# Patient Record
Sex: Male | Born: 1960 | Race: White | Hispanic: No | Marital: Single | Smoking: Former smoker
Health system: Southern US, Community
[De-identification: ages and names within clinical notes are randomized; demographics above are authoritative.]

---

## 2010-07-26 ENCOUNTER — Ambulatory Visit: Payer: Self-pay | Admitting: Internal Medicine

## 2012-05-31 ENCOUNTER — Ambulatory Visit: Payer: Self-pay | Admitting: Internal Medicine

## 2013-05-31 ENCOUNTER — Ambulatory Visit: Payer: Self-pay | Admitting: Family Medicine

## 2019-06-10 ENCOUNTER — Ambulatory Visit: Payer: Self-pay | Attending: Internal Medicine

## 2019-06-10 DIAGNOSIS — Z23 Encounter for immunization: Secondary | ICD-10-CM

## 2019-06-10 NOTE — Progress Notes (Signed)
   Covid-19 Vaccination Clinic  Name:  HRISHIKESH HOEG    MRN: [id number] DOB: 12-21-1960  06/10/2019  Mr. Korn was observed post Covid-19 immunization for 30 minutes based on pre-vaccination screening without incident. He was provided with Vaccine Information Sheet and instruction to access the V-Safe system.   Mr. Broski was instructed to call 911 with any severe reactions post vaccine: Marland Kitchen Difficulty breathing  . Swelling of face and throat  . A fast heartbeat  . A bad rash all over body  . Dizziness and weakness   Immunizations Administered    Name Date Dose VIS Date Route   Pfizer COVID-19 Vaccine 06/10/2019 12:51 PM 0.3 mL 02/22/2019 Intramuscular   Manufacturer: [employer], [employer]   Lot: [id number]   NDC: [address]-6

## 2019-07-03 ENCOUNTER — Ambulatory Visit: Payer: Self-pay | Attending: Internal Medicine

## 2019-07-03 DIAGNOSIS — Z23 Encounter for immunization: Secondary | ICD-10-CM

## 2019-07-03 NOTE — Progress Notes (Signed)
   Covid-19 Vaccination Clinic  Name:  Kevin Friedman    MRN: [id number] DOB: [birth year]  07/03/2019  Kevin Friedman was observed post Covid-19 immunization for 15 minutes without incident. He was provided with Vaccine Information Sheet and instruction to access the V-Safe system.   Kevin Friedman was instructed to call 911 with any severe reactions post vaccine: Marland Kitchen Difficulty breathing  . Swelling of face and throat  . A fast heartbeat  . A bad rash all over body  . Dizziness and weakness   Immunizations Administered    Name Date Dose VIS Date Route   Pfizer COVID-19 Vaccine 07/03/2019  1:34 PM 0.3 mL 05/08/2018 Intramuscular   Manufacturer: [employer], [employer]   Lot: [id number]   NDC: [address]-1

## 2019-07-26 ENCOUNTER — Encounter: Payer: Self-pay | Admitting: Emergency Medicine

## 2019-07-26 ENCOUNTER — Other Ambulatory Visit: Payer: Self-pay

## 2019-07-26 ENCOUNTER — Emergency Department

## 2019-07-26 ENCOUNTER — Emergency Department
Admission: EM | Admit: 2019-07-26 | Discharge: 2019-07-26 | Disposition: A | Discharge status: Home / Self Care | Attending: Emergency Medicine | Admitting: Emergency Medicine

## 2019-07-26 DIAGNOSIS — Y990 Civilian activity done for income or pay: Secondary | ICD-10-CM | POA: Diagnosis not present

## 2019-07-26 DIAGNOSIS — S61215A Laceration without foreign body of left ring finger without damage to nail, initial encounter: Secondary | ICD-10-CM | POA: Diagnosis present

## 2019-07-26 DIAGNOSIS — W231XXA Caught, crushed, jammed, or pinched between stationary objects, initial encounter: Secondary | ICD-10-CM | POA: Insufficient documentation

## 2019-07-26 DIAGNOSIS — R52 Pain, unspecified: Secondary | ICD-10-CM

## 2019-07-26 DIAGNOSIS — Y9289 Other specified places as the place of occurrence of the external cause: Secondary | ICD-10-CM | POA: Diagnosis not present

## 2019-07-26 DIAGNOSIS — Y9389 Activity, other specified: Secondary | ICD-10-CM | POA: Insufficient documentation

## 2019-07-26 MED ORDER — OXYCODONE-ACETAMINOPHEN 7.5-325 MG PO TABS: 1.0000 | ORAL_TABLET | Freq: Four times a day (QID) | ORAL | 0 refills | Status: AC | PRN

## 2019-07-26 MED ORDER — LIDOCAINE HCL (PF) 1 % IJ SOLN
5.0000 mL | Freq: Once | INTRAMUSCULAR | Status: DC
  Filled 2019-07-26: qty 5

## 2019-07-26 MED ORDER — OXYCODONE-ACETAMINOPHEN 5-325 MG PO TABS
1.0000 | ORAL_TABLET | Freq: Once | ORAL | Status: AC
  Administered 2019-07-26: 1 via ORAL
  Filled 2019-07-26: qty 1

## 2019-07-26 MED ORDER — OXYCODONE-ACETAMINOPHEN 7.5-325 MG PO TABS: 1.0000 | ORAL_TABLET | Freq: Four times a day (QID) | ORAL | 0 refills | Status: DC | PRN

## 2019-07-26 MED ORDER — BACITRACIN-NEOMYCIN-POLYMYXIN 400-5-5000 EX OINT
TOPICAL_OINTMENT | Freq: Once | CUTANEOUS | Status: AC
  Filled 2019-07-26: qty 1

## 2019-07-26 NOTE — ED Provider Notes (Signed)
[facility] Emergency Department Provider Note   ____________________________________________   First MD Initiated Contact with Patient 07/26/19 1240     (approximate)  I have reviewed the triage vital signs and the nursing notes.   HISTORY  Chief Complaint Laceration    HPI Kevin Friedman is a 59 y.o. male patient presents for laceration to the palmar aspect of the left ring finger.  Hemorrhage was controlled direct pressure.  Patient denies loss sensation or loss of function.  Patient is right-hand dominant/ tetanus shot is up-to-date.         History reviewed. No pertinent past medical history.  There are no problems to display for this patient.   History reviewed. No pertinent surgical history.  Prior to Admission medications   Medication Sig Start Date End Date Taking? Authorizing Provider  oxyCODONE-acetaminophen (PERCOCET) 7.5-325 MG tablet Take 1 tablet by mouth every 6 (six) hours as needed. 07/26/19   Joni Reining, PA-C    Allergies Penicillins  No family history on file.  Social History Social History   Tobacco Use  . Smoking status: Former Games developer  . Smokeless tobacco: Never Used  Substance Use Topics  . Alcohol use: Yes    Comment: socially  . Drug use: Never    Review of Systems Constitutional: No fever/chills Eyes: No visual changes. ENT: No sore throat. Cardiovascular: Denies chest pain. Respiratory: Denies shortness of breath. Gastrointestinal: No abdominal pain.  No nausea, no vomiting.  No diarrhea.  No constipation. Genitourinary: Negative for dysuria. Musculoskeletal: Negative for back pain. Skin: Negative for rash.  Left finger laceration Neurological: Negative for headaches, focal weakness or numbness.   ____________________________________________   PHYSICAL EXAM:  VITAL SIGNS: ED Triage Vitals  Enc Vitals Group     BP 07/26/19 1214 123/82     Pulse Rate 07/26/19 1214 82     Resp 07/26/19  1214 20     Temp 07/26/19 1214 98.6 F (37 C)     Temp Source 07/26/19 1214 Oral     SpO2 07/26/19 1214 93 %     Weight 07/26/19 1207 206 lb (93.4 kg)     Height 07/26/19 1207 5\' 9"  (1.753 m)     Head Circumference --      Peak Flow --      Pain Score 07/26/19 1207 6     Pain Loc --      Pain Edu? --      Excl. in GC? --    Constitutional: Alert and oriented. Well appearing and in no acute distress. Cardiovascular: Normal rate, regular rhythm. Grossly normal heart sounds.  Good peripheral circulation. Respiratory: Normal respiratory effort.  No retractions. Lungs CTAB. Skin: 2 cm laceration to the distal ring finger left hand. Psychiatric: Mood and affect are normal. Speech and behavior are normal.  ____________________________________________   LABS (all labs ordered are listed, but only abnormal results are displayed)  Labs Reviewed - No data to display ____________________________________________  EKG   ____________________________________________  RADIOLOGY  ED MD interpretation:    Official radiology report(s): DG Finger Ring Left  Result Date: 07/26/2019 CLINICAL DATA:  Laceration LEFT ring finger 1 inch long, caught finger between 2 machines EXAM: LEFT RING FINGER 2+V COMPARISON:  None FINDINGS: Scattered tiny radiopacities which may represent internal or external foreign bodies. Osseous mineralization normal. Soft tissue swelling especially at PIP joint. No acute fracture, dislocation, or bone destruction. IMPRESSION: No acute osseous abnormalities. Tiny radiopacities which could represent internal or  external foreign bodies. Electronically Signed   By: Lavonia Dana M.D.   On: 07/26/2019 13:28    ____________________________________________   PROCEDURES  Procedure(s) performed (including Critical Care):  Marland KitchenMarland KitchenLaceration Repair  Date/Time: 07/26/2019 1:36 PM Performed by: Sable Feil, PA-C Authorized by: Sable Feil, PA-C   Consent:    Consent  obtained:  Verbal   Consent given by:  Patient   Risks discussed:  Infection, pain, need for additional repair, poor cosmetic result and poor wound healing Anesthesia (see MAR for exact dosages):    Anesthesia method:  Nerve block   Block needle gauge:  25 G   Block anesthetic:  Lidocaine 1% w/o epi   Block injection procedure:  Incremental injection   Block outcome:  Anesthesia achieved Laceration details:    Location:  Finger   Finger location:  L ring finger   Length (cm):  2.5   Depth (mm):  2 Repair type:    Repair type:  Simple Pre-procedure details:    Preparation:  Patient was prepped and draped in usual sterile fashion and imaging obtained to evaluate for foreign bodies Exploration:    Hemostasis achieved with:  Direct pressure   Contaminated: yes   Treatment:    Area cleansed with:  Betadine and saline   Amount of cleaning:  Standard   Irrigation method:  Syringe   Visualized foreign bodies/material removed: yes   Skin repair:    Repair method:  Sutures   Suture size:  4-0   Suture material:  Nylon   Suture technique:  Simple interrupted   Number of sutures:  6 Approximation:    Approximation:  Close Post-procedure details:    Dressing:  Antibiotic ointment and sterile dressing   Patient tolerance of procedure:  Tolerated well, no immediate complications     ____________________________________________   INITIAL IMPRESSION / ASSESSMENT AND PLAN / ED COURSE  As part of my medical decision making, I reviewed the following data within the [place]     Patient presents with laceration and contusion to the left ring finger.  Discussed x-ray findings with patient.  See procedure note for wound closure.    Kevin Friedman was evaluated in Emergency Department on 07/26/2019 for the symptoms described in the history of present illness. He was evaluated in the context of the global COVID-19 pandemic, which necessitated consideration that the  patient might be at risk for infection with the SARS-CoV-2 virus that causes COVID-19. Institutional protocols and algorithms that pertain to the evaluation of patients at risk for COVID-19 are in a state of rapid change based on information released by regulatory bodies including the CDC and federal and state organizations. These policies and algorithms were followed during the patient's care in the ED.       ____________________________________________   FINAL CLINICAL IMPRESSION(S) / ED DIAGNOSES  Final diagnoses:  Laceration of left ring finger without foreign body without damage to nail, initial encounter     ED Discharge Orders         Ordered    oxyCODONE-acetaminophen (PERCOCET) 7.5-325 MG tablet  Every 6 hours PRN     07/26/19 1341           Note:  This document was prepared using Dragon voice recognition software and may include unintentional dictation errors.    Sable Feil, PA-C 07/26/19 1353    Earleen Newport, MD 07/26/19 [phone]

## 2019-07-26 NOTE — ED Notes (Signed)
Dressing w neosporin to left index finger

## 2019-07-26 NOTE — ED Triage Notes (Signed)
Pt presents to ED via POV with HR rep, pt with laceration to L ring finger, bleeding controlled upon arrival, clean dressing applied by this RN. Pt states got his finger caught between machines to cause laceration, laceration approx 1 inch long.    Pt states last tetanus shot approx 3 months ago

## 2019-07-26 NOTE — Discharge Instructions (Signed)
Follow discharge care instruction take medication as needed for pain.  Return back in 10 days have sutures removed.

## 2019-07-26 NOTE — ED Notes (Signed)
See triage note  Presents with laceration to left ring finger  States caught between machines

## 2019-08-05 ENCOUNTER — Emergency Department
Admission: EM | Admit: 2019-08-05 | Discharge: 2019-08-05 | Disposition: A | Discharge status: Home / Self Care | Attending: Emergency Medicine | Admitting: Emergency Medicine

## 2019-08-05 ENCOUNTER — Encounter: Payer: Self-pay | Admitting: Emergency Medicine

## 2019-08-05 ENCOUNTER — Other Ambulatory Visit: Payer: Self-pay

## 2019-08-05 DIAGNOSIS — Z4802 Encounter for removal of sutures: Secondary | ICD-10-CM | POA: Diagnosis present

## 2019-08-05 DIAGNOSIS — Z87891 Personal history of nicotine dependence: Secondary | ICD-10-CM | POA: Diagnosis not present

## 2019-08-05 NOTE — ED Triage Notes (Signed)
Suture removal of stitches from left fourth finger.  Wound well approximated.  Clean and dry.

## 2019-08-05 NOTE — ED Notes (Signed)
Pt presents for suture removal to L 4th digit at this time. Pt with well healed laceration to digit, denies drainage or redness, states some continued tenderness at this time.

## 2019-08-05 NOTE — ED Provider Notes (Signed)
[facility] Emergency Department Provider Note  ____________________________________________  Time seen: Approximately 9:41 AM  I have reviewed the triage vital signs and the nursing notes.   HISTORY  Chief Complaint Suture / Staple Removal    HPI Kevin Friedman is a 59 y.o. male that presents to the emergency department for suture removal.  Patient had sutures placed 10 days ago.  He has had some intermittent numbness.  No significant pain, drainage.   History reviewed. No pertinent past medical history.  There are no problems to display for this patient.   History reviewed. No pertinent surgical history.  Prior to Admission medications   Medication Sig Start Date End Date Taking? Authorizing Provider  oxyCODONE-acetaminophen (PERCOCET) 7.5-325 MG tablet Take 1 tablet by mouth every 6 (six) hours as needed. 07/26/19   Joni Reining, PA-C    Allergies Penicillins  No family history on file.  Social History Social History   Tobacco Use  . Smoking status: Former Games developer  . Smokeless tobacco: Never Used  Substance Use Topics  . Alcohol use: Yes    Comment: socially  . Drug use: Never     Review of Systems  Constitutional: No fever/chills Gastrointestinal: No vomiting.  Musculoskeletal: Negative for musculoskeletal pain. Skin: Negative for rash, abrasions, ecchymosis.  Positive for sutured laceration. Neurological: Positive for intermittent numbness.   ____________________________________________   PHYSICAL EXAM:  VITAL SIGNS: ED Triage Vitals  Enc Vitals Group     BP 08/05/19 0846 116/75     Pulse Rate 08/05/19 0846 75     Resp 08/05/19 0846 16     Temp 08/05/19 0846 98.6 F (37 C)     Temp Source 08/05/19 0846 Oral     SpO2 08/05/19 0846 95 %     Weight 08/05/19 0844 205 lb 14.6 oz (93.4 kg)     Height 08/05/19 0844 5\' 9"  (1.753 m)     Head Circumference --      Peak Flow --      Pain Score 08/05/19 0844 0     Pain Loc  --      Pain Edu? --      Excl. in GC? --      Constitutional: Alert and oriented. Well appearing and in no acute distress. Eyes: Conjunctivae are normal. PERRL. EOMI. Head: Atraumatic. ENT:      Ears:      Nose: No congestion/rhinnorhea.      Mouth/Throat: Mucous membranes are moist.  Neck: No stridor. Cardiovascular: Normal rate, regular rhythm.  Good peripheral circulation. Respiratory: Normal respiratory effort without tachypnea or retractions. Lungs CTAB. Good air entry to the bases with no decreased or absent breath sounds. Musculoskeletal: Full range of motion to all extremities. No gross deformities appreciated.  Sensation intact to left distal ring finger. Neurologic:  Normal speech and language. No gross focal neurologic deficits are appreciated.  Skin:  Skin is warm, dry and intact.  2 cm laceration to distal left ring finger with sutures in place.  No surrounding erythema.  No drainage. Psychiatric: Mood and affect are normal. Speech and behavior are normal. Patient exhibits appropriate insight and judgement.   ____________________________________________   LABS (all labs ordered are listed, but only abnormal results are displayed)  Labs Reviewed - No data to display ____________________________________________  EKG   ____________________________________________  RADIOLOGY   No results found.  ____________________________________________    PROCEDURES  Procedure(s) performed:    Procedures  SUTURE REMOVAL Performed by: 08/07/19  Consent: Verbal consent obtained. Patient identity confirmed: provided demographic data Time out: Immediately prior to procedure a "time out" was called to verify the correct patient, procedure, equipment, support staff and site/side marked as required.  Location details: finer  Wound Appearance: clean  Sutures/Staples Removed: 3  Facility: sutures placed in this facility Patient tolerance: Patient tolerated  the procedure well with no immediate complications.    Medications - No data to display   ____________________________________________   INITIAL IMPRESSION / ASSESSMENT AND PLAN / ED COURSE  Pertinent labs & imaging results that were available during my care of the patient were reviewed by me and considered in my medical decision making (see chart for details).  Review of the [place] CSRS was performed in accordance of the [place] prior to dispensing any controlled drugs.     Patient presented to the emergency department for suture removal.  Vital signs and exam are reassuring.  3 sutures were removed in the emergency department.  Patient still has 2 sutures in place.  The center of the laceration does not look like it has completely healed yet and I suspect that it will benefit from 2-3 more days of healing with sutures in place.  Patient will return to the emergency department in 2 to 3 days for suture removal.  Patient is to follow up with emergency department or urgent care as directed. Patient is given ED precautions to return to the ED for any worsening or new symptoms.   [name] was evaluated in Emergency Department on 08/05/2019 for the symptoms described in the history of present illness. He was evaluated in the context of the global COVID-19 pandemic, which necessitated consideration that the patient might be at risk for infection with the SARS-CoV-2 virus that causes COVID-19. Institutional protocols and algorithms that pertain to the evaluation of patients at risk for COVID-19 are in a state of rapid change based on information released by regulatory bodies including the CDC and federal and state organizations. These policies and algorithms were followed during the patient's care in the ED.  ____________________________________________  FINAL CLINICAL IMPRESSION(S) / ED DIAGNOSES  Final diagnoses:  Visit for suture removal      NEW MEDICATIONS STARTED DURING THIS  VISIT:  ED Discharge Orders    None          This chart was dictated using voice recognition software/Dragon. Despite best efforts to proofread, errors can occur which can change the meaning. Any change was purely unintentional.    Laban Emperor, PA-C 08/05/19 1427    Delman Kitten, MD 08/05/19 1547

## 2019-08-08 ENCOUNTER — Emergency Department
Admission: EM | Admit: 2019-08-08 | Discharge: 2019-08-08 | Disposition: A | Discharge status: Home / Self Care | Attending: Emergency Medicine | Admitting: Emergency Medicine

## 2019-08-08 ENCOUNTER — Encounter: Payer: Self-pay | Admitting: Emergency Medicine

## 2019-08-08 ENCOUNTER — Other Ambulatory Visit: Payer: Self-pay

## 2019-08-08 DIAGNOSIS — Z4802 Encounter for removal of sutures: Secondary | ICD-10-CM

## 2019-08-08 DIAGNOSIS — Z87891 Personal history of nicotine dependence: Secondary | ICD-10-CM | POA: Diagnosis not present

## 2019-08-08 NOTE — ED Provider Notes (Signed)
[facility] Emergency Department Provider Note   ____________________________________________   First MD Initiated Contact with Patient 08/08/19 [id number]     (approximate)  I have reviewed the triage vital signs and the nursing notes.   HISTORY  Chief Complaint Suture / Staple Removal   HPI Kevin Friedman is a 59 y.o. male presents to the ED for suture removal.  Patient was originally seen on 07/26/2019 for laceration to his left fourth digit.  On 5/24 all but 2 sutures were removed.  Patient is here to have the remaining 2 sutures removed.  He denies any complaints with his healing finger.     History reviewed. No pertinent past medical history.  There are no problems to display for this patient.   History reviewed. No pertinent surgical history.  Prior to Admission medications   Medication Sig Start Date End Date Taking? Authorizing Provider  oxyCODONE-acetaminophen (PERCOCET) 7.5-325 MG tablet Take 1 tablet by mouth every 6 (six) hours as needed. 07/26/19   Joni Reining, PA-C    Allergies Penicillins  No family history on file.  Social History Social History   Tobacco Use  . Smoking status: Former Games developer  . Smokeless tobacco: Never Used  Substance Use Topics  . Alcohol use: Yes    Comment: socially  . Drug use: Never    Review of Systems Constitutional: No fever/chills Cardiovascular: Denies chest pain. Respiratory: Denies shortness of breath. Musculoskeletal: Negative for left hand pain. Skin: Resolving laceration left fourth digit. Neurological: Negative for  focal weakness or numbness. ____________________________________________   PHYSICAL EXAM:  VITAL SIGNS: ED Triage Vitals [08/08/19 0911]  Enc Vitals Group     BP 120/76     Pulse Rate 78     Resp 20     Temp 98 F (36.7 C)     Temp Source Oral     SpO2 99 %     Weight 205 lb 0.4 oz (93 kg)     Height 5\' 9"  (1.753 m)     Head Circumference      Peak Flow    Pain Score 0     Pain Loc      Pain Edu?      Excl. in GC?     Constitutional: Alert and oriented. Well appearing and in no acute distress. Eyes: Conjunctivae are normal.  Head: Atraumatic. Neck: No stridor.   Cardiovascular: Normal rate, regular rhythm. Grossly normal heart sounds.  Good peripheral circulation. Respiratory: Normal respiratory effort.  No retractions. Lungs CTAB. Musculoskeletal: On the volar aspect of the left fourth digit there is a well-healed wound with 2 remaining sutures.  No drainage, erythema or tenderness is noted.  Range of motion is unrestricted. Neurologic:  Normal speech and language. No gross focal neurologic deficits are appreciated. No gait instability. Skin:  Skin is warm, dry and intact.  Psychiatric: Mood and affect are normal. Speech and behavior are normal.  ____________________________________________   LABS (all labs ordered are listed, but only abnormal results are displayed)  Labs Reviewed - No data to display  PROCEDURES  Procedure(s) performed (including Critical Care):  Procedures  Nylon sutures x2 was removed by , PA-C  ____________________________________________   INITIAL IMPRESSION / ASSESSMENT AND PLAN / ED COURSE  As part of my medical decision making, I reviewed the following data within the electronic MEDICAL RECORD NUMBER Notes from prior ED visits and [place] Controlled Substance Database  59 year old male presents to the ED for suture  removal left fourth finger.Marland Kitchen  Appears to be healing without any signs of infection.  Sutures removed without any difficulty.  Patient is instructed to continue keeping area clean and dry and watch for any signs of infection.  His company requires that he be seen in the emergency department and he was made aware to return if he saw any redness or drainage.  ____________________________________________   FINAL CLINICAL IMPRESSION(S) / ED DIAGNOSES  Final diagnoses:  Encounter for  removal of sutures     ED Discharge Orders    None       Note:  This document was prepared using Dragon voice recognition software and may include unintentional dictation errors.    Johnn Hai, PA-C 08/08/19 1438    Vanessa [place], MD 08/08/19 1726

## 2019-08-08 NOTE — Discharge Instructions (Addendum)
Return to the emergency department if any changes or concerns.  Area appears to be healing well without any signs of infection.  Continue to clean daily with mild soap and water and completely dry.

## 2019-08-08 NOTE — ED Triage Notes (Signed)
Here to have suture removed from 4 th finger

## 2020-06-16 ENCOUNTER — Other Ambulatory Visit: Payer: Self-pay | Admitting: Physician Assistant

## 2020-06-16 DIAGNOSIS — E041 Nontoxic single thyroid nodule: Secondary | ICD-10-CM

## 2020-07-15 ENCOUNTER — Other Ambulatory Visit: Payer: Self-pay

## 2020-07-15 ENCOUNTER — Ambulatory Visit
Admission: RE | Admit: 2020-07-15 | Discharge: 2020-07-15 | Disposition: A | Discharge status: Home / Self Care | Payer: PRIVATE HEALTH INSURANCE | Source: Ambulatory Visit | Attending: Physician Assistant | Admitting: Physician Assistant

## 2020-07-15 DIAGNOSIS — E041 Nontoxic single thyroid nodule: Secondary | ICD-10-CM | POA: Diagnosis present

## 2020-08-30 NOTE — Addendum Note (Signed)
Encounter addended by: Novella Olive on: 08/30/2020 12:54 PM  Actions taken: Letter saved

## 2021-08-09 ENCOUNTER — Encounter (HOSPITAL_COMMUNITY): Payer: Self-pay | Admitting: Radiology

## 2021-09-03 IMAGING — US US THYROID
1 series · 12 of 25 positions shown · non-contrast
Comparison: None.

CLINICAL DATA: Other. 60-year-old male with history of thyroid
nodule.

EXAM:
THYROID ULTRASOUND
TECHNIQUE: Ultrasound examination of the thyroid gland and adjacent soft
tissues was performed.

[Series 1: us thyroid · 0.09mm/px · 74 acquisitions, 12 frames shown]
[im 4/74]
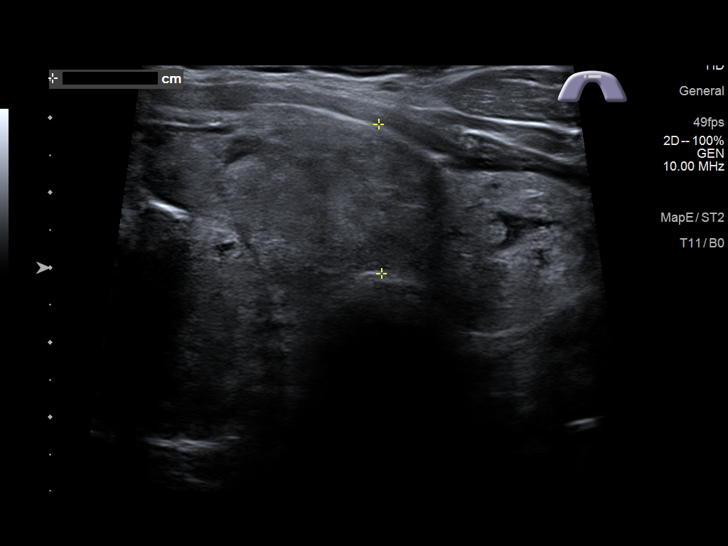
[im 10/74]
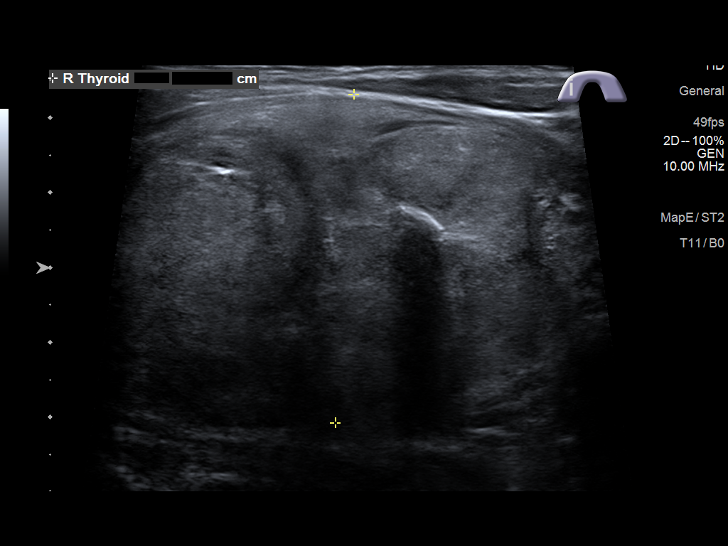
[im 16/74]
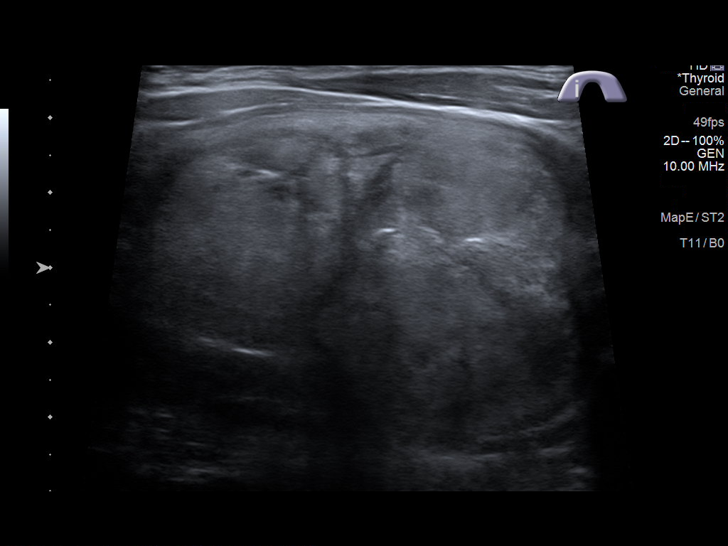
[im 22/74]
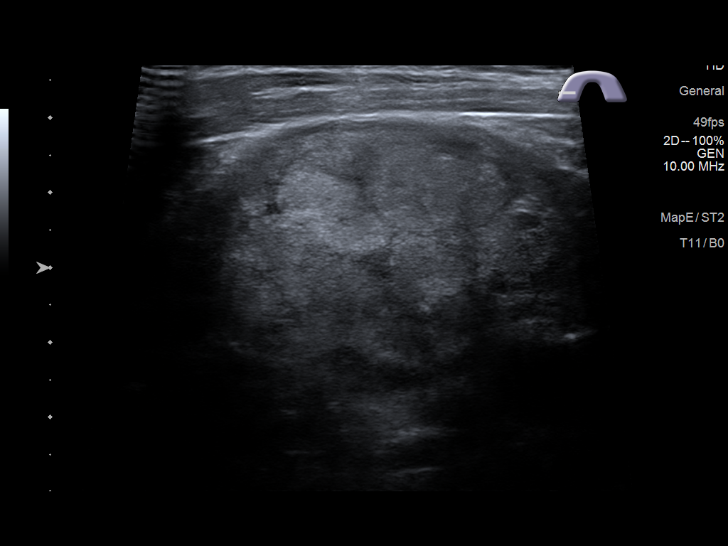
[im 28/74]
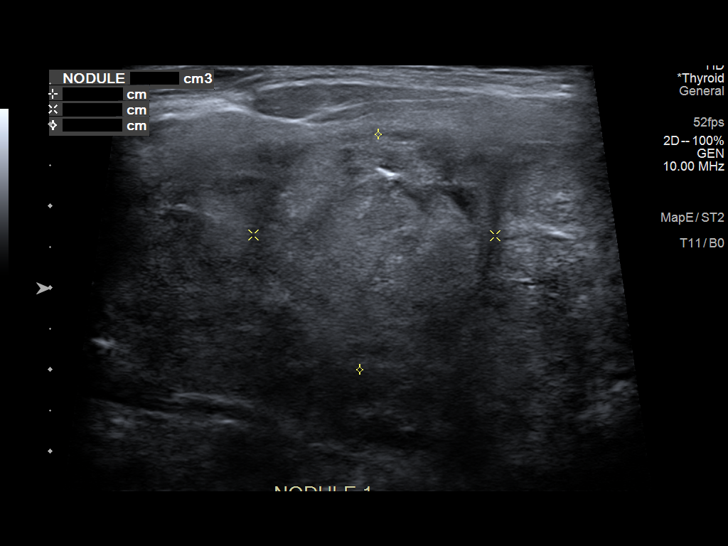
[im 34/74]
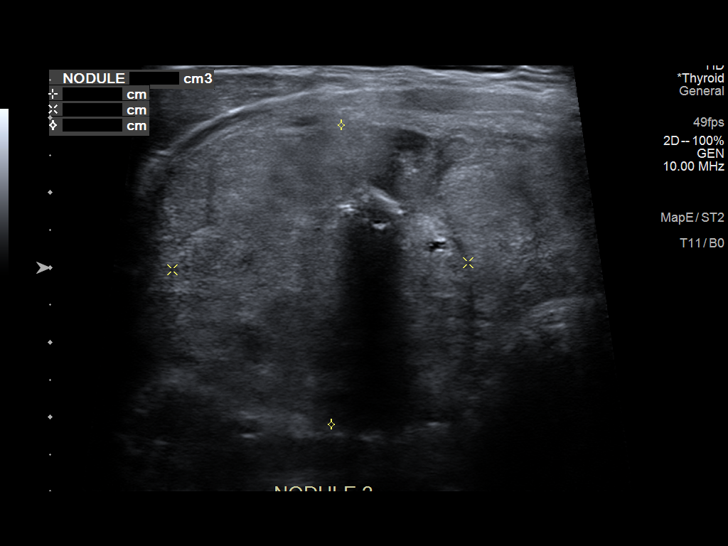
[im 40/74]
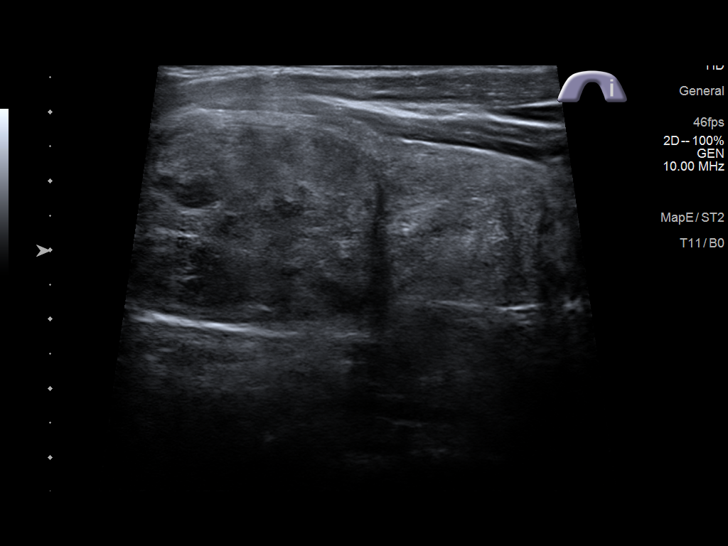
[im 46/74]
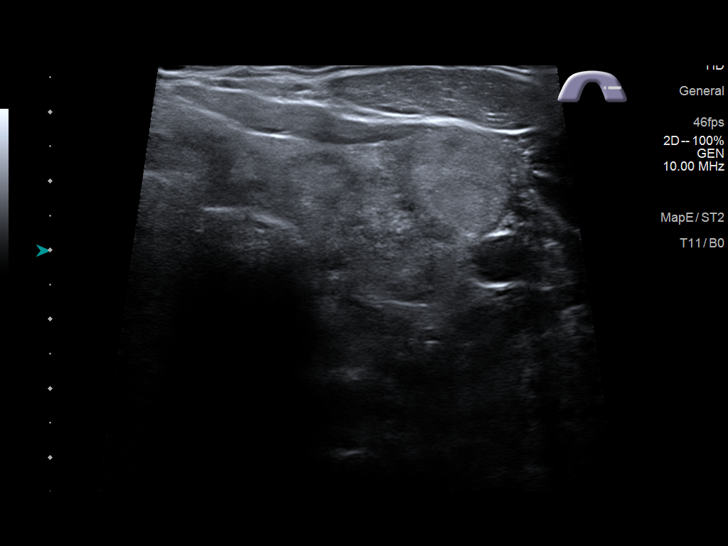
[im 52/74]
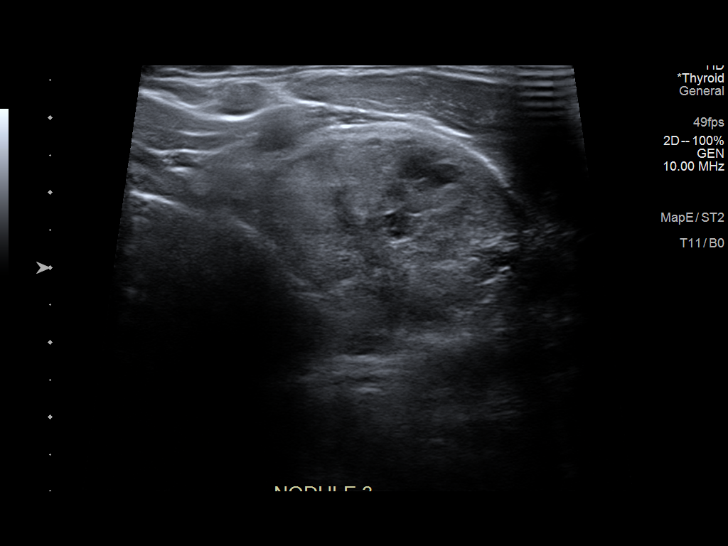
[im 58/74]
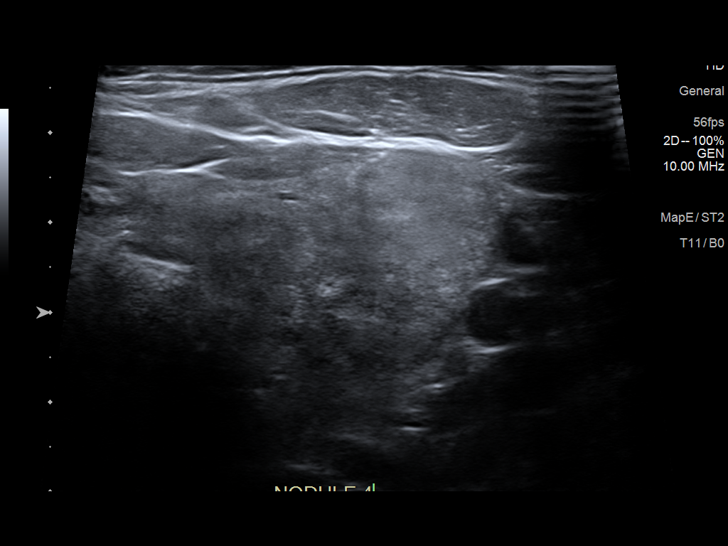
[im 64/74]
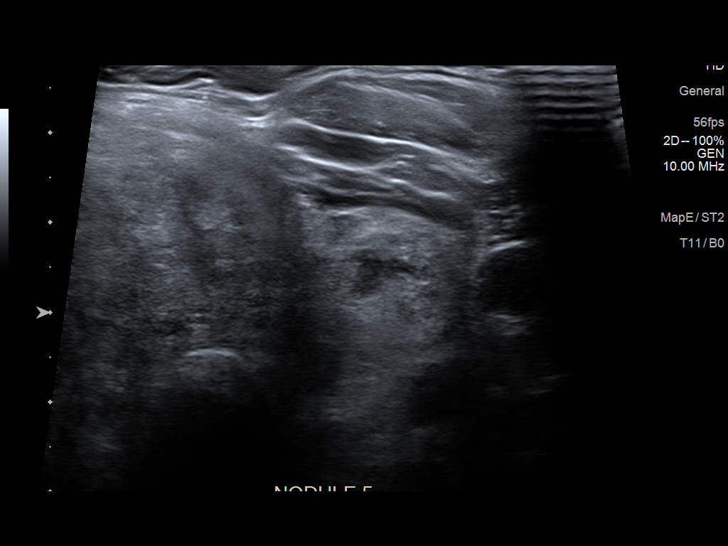
[im 70/74]
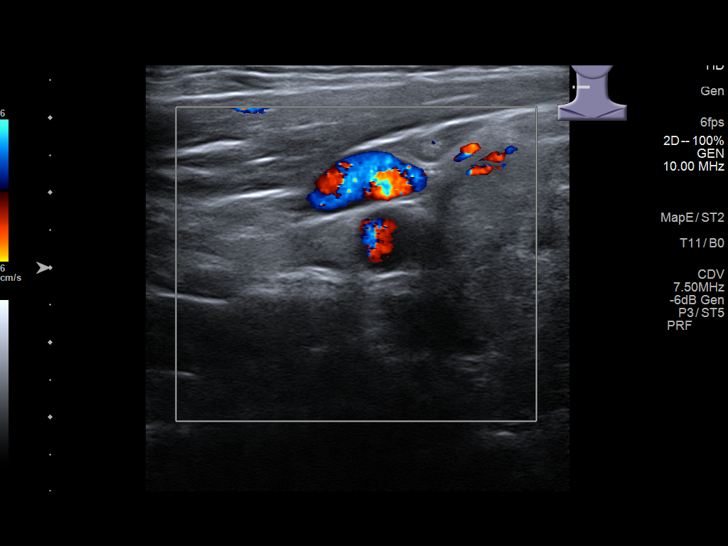

[12 of 25 positions shown; findings below may reference images not displayed]

FINDINGS: Parenchymal Echotexture: Markedly heterogenous

Isthmus: 2.0 cm

Right lobe: 8.4 x 4.4 x 5.4 cm

Left lobe: 7.2 x 3.8 x 3.1 cm

_________________________________________________________

Estimated total number of nodules >/= 1 cm: 5

Number of spongiform nodules >/=  2 cm not described below (TR1): 0

Number of mixed cystic and solid nodules >/= 1.5 cm not described
below (TR2): 0

_________________________________________________________

Nodule # 1:

Location: Right; Superior

Maximum size: 3.0 cm; Other 2 dimensions: 2.9 x 2.7 cm

Composition: solid/almost completely solid (2)

Echogenicity: isoechoic (1)

Shape: not taller-than-wide (0)

Margins: ill-defined (0)

Echogenic foci: none (0)

ACR TI-RADS total points: 3.

ACR TI-RADS risk category: TR3 (3 points).

ACR TI-RADS recommendations:

**Given size (>/= 2.5 cm) and appearance, fine needle aspiration of
this mildly suspicious nodule should be considered based on TI-RADS
criteria.

_________________________________________________________

Nodule # 2:

Location: Right; Mid

Maximum size: 4.0 cm; Other 2 dimensions: 4.0 x 3.6 cm

Composition: solid/almost completely solid (2)

Echogenicity: isoechoic (1)

Shape: not taller-than-wide (0)

Margins: lobulated/irregular (2)

Echogenic foci: macrocalcifications (1)

ACR TI-RADS total points: 6.

ACR TI-RADS risk category: TR4 (4-6 points).

ACR TI-RADS recommendations:

**Given size (>/= 1.5 cm) and appearance, fine needle aspiration of
this moderately suspicious nodule should be considered based on
TI-RADS criteria.

_________________________________________________________

Nodule # 3:

Location: Left; Superior

Maximum size: 3.9 cm; Other 2 dimensions: 3.5 x 2.6 cm

Composition: solid/almost completely solid (2)

Echogenicity: isoechoic (1)

Shape: not taller-than-wide (0)

Margins: smooth (0)

Echogenic foci: none (0)

ACR TI-RADS total points: 3.

ACR TI-RADS risk category: TR3 (3 points).

ACR TI-RADS recommendations:

**Given size (>/= 2.5 cm) and appearance, fine needle aspiration of
this mildly suspicious nodule should be considered based on TI-RADS
criteria.

_________________________________________________________

Nodule # 4:

Location: Left; Mid

Maximum size: 1.9 cm; Other 2 dimensions: 1.6 x 1.5 cm

Composition: solid/almost completely solid (2)

Echogenicity: hyperechoic (1)

Shape: not taller-than-wide (0)

Margins: smooth (0)

Echogenic foci: none (0)

ACR TI-RADS total points: 3.

ACR TI-RADS risk category: TR3 (3 points).

ACR TI-RADS recommendations:

*Given size (>/= 1.5 - 2.4 cm) and appearance, a follow-up
ultrasound in 1 year should be considered based on TI-RADS criteria.

_________________________________________________________

Nodule # 5:

Location: Left; Inferior

Maximum size: 1.8 cm; Other 2 dimensions: 1.6 x 1.4 cm

Composition: solid/almost completely solid (2)

Echogenicity: isoechoic (1)

Shape: not taller-than-wide (0)

Margins: ill-defined (0)

Echogenic foci: none (0)

ACR TI-RADS total points: 3.

ACR TI-RADS risk category: TR3 (3 points).

ACR TI-RADS recommendations:

*Given size (>/= 1.5 - 2.4 cm) and appearance, a follow-up
ultrasound in 1 year should be considered based on TI-RADS criteria.

_________________________________________________________
IMPRESSION: 1. Multinodular goiter.
2. Solid nodules in the right mid (labeled 2, 4.0 cm, TI-RADS
category 4) and left superior thyroid ((labeled 3, 3.9 cm, TI-RADS
category 3) meet criteria for tissue sampling. Recommend
ultrasound-guided fine-needle aspiration of both nodules.
3. Solid nodule in the right superior thyroid (labeled 1, 3.0 cm,
TI-RADS category 3) technically meets criteria for biopsy, however
only 2 nodules are recommended for biopsy at a single time.
Therefore recommend ultrasound follow-up in 1 year of this nodule in
addition to solid nodules in the left mid (labeled 4, 1.9 cm,
TI-RADS category 3) and inferior (labeled 5, 1.8 cm, TI-RADS
category 3) thyroid.

The above is in keeping with the ACR TI-RADS recommendations - [HOSPITAL] [id number];[DATE].
